# Patient Record
Sex: Male | Born: 1995 | Race: White | Hispanic: No | Marital: Single | Smoking: Never smoker
Health system: Southern US, Community
[De-identification: ages and names within clinical notes are randomized; demographics above are authoritative.]

## PROBLEM LIST (undated history)

## (undated) DIAGNOSIS — T7840XA Allergy, unspecified, initial encounter: Secondary | ICD-10-CM

## (undated) DIAGNOSIS — R569 Unspecified convulsions: Secondary | ICD-10-CM

## (undated) HISTORY — PX: NO PAST SURGERIES: SHX2092

## (undated) HISTORY — DX: Allergy, unspecified, initial encounter: T78.40XA

---
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
## 2015-04-15 ENCOUNTER — Ambulatory Visit
Admission: EM | Admit: 2015-04-15 | Discharge: 2015-04-15 | Disposition: A | Discharge status: Home / Self Care | Payer: Medicaid Other | Attending: Family Medicine | Admitting: Family Medicine

## 2015-04-15 ENCOUNTER — Encounter: Payer: Self-pay | Admitting: Emergency Medicine

## 2015-04-15 DIAGNOSIS — B070 Plantar wart: Secondary | ICD-10-CM

## 2015-04-15 HISTORY — DX: Unspecified convulsions: R56.9

## 2015-04-15 NOTE — ED Provider Notes (Signed)
CSN: [id number]     Arrival date & time 04/15/15  1328 History   First MD Initiated Contact with Patient 04/15/15 1549     Chief Complaint  Patient presents with  . Foot Pain   (Consider location/radiation/quality/duration/timing/severity/associated sxs/prior Treatment) HPI Comments: 19 yo male with a 2-4 weeks h/o right foot pain to bottom of foot. States he noticed 2 "sore spots" that are more painful when he's walking. Denies any trauma, injuries, drainage, redness, swelling.   The history is provided by the patient.    Past Medical History  Diagnosis Date  . Seizures [facility](HCC)    Past Surgical History  Procedure Laterality Date  . No past surgeries     No family history on file. Social History  Substance Use Topics  . Smoking status: Never Smoker   . Smokeless tobacco: Never Used  . Alcohol Use: No    Review of Systems  Allergies  Review of patient's allergies indicates no known allergies.  Home Medications   Prior to Admission medications   Not on File   Meds Ordered and Administered this Visit  Medications - No data to display  BP 117/78 mmHg  Pulse 52  Temp(Src) 97.5 F (36.4 C) (Tympanic)  Resp 18  Ht 5\' 8"  (1.727 m)  Wt 173 lb 3.2 oz (78.563 kg)  BMI 26.34 kg/m2  SpO2 100% No data found.   Physical Exam  Constitutional: He appears well-developed and well-nourished. No distress.  Musculoskeletal:       Right foot: Normal.  Skin: He is not diaphoretic.  2 plantar wart-like skin lesions on right plantar foot with mild tenderness to palpation; no drainage, redness or swelling.   Nursing note and vitals reviewed.   ED Course  Procedures (including critical care time)  Labs Review Labs Reviewed - No data to display  Imaging Review No results found.   Visual Acuity Review  Right Eye Distance:   Left Eye Distance:   Bilateral Distance:    Right Eye Near:   Left Eye Near:    Bilateral Near:         MDM   1. Plantar warts   (right  foot)  1. diagnosis reviewed with patient; recommend otc wart medication  2.Follow-up prn if symptoms worsen or don't improve   Payton Mccallumrlando Clifford Benninger, MD 04/15/15 1650

## 2015-04-15 NOTE — ED Notes (Signed)
Blister on bottom of right foot for 2 weeks.  Painful and hard to walk

## 2015-04-15 NOTE — Discharge Instructions (Signed)
Plantar Warts Warts are small growths on the skin. They can occur on various areas of the body. When they occur on the underside (sole) of the foot, they are called plantar warts. Plantar warts often occur in groups, with several small warts around a larger growth. They tend to develop over areas of pressure, such as the heel or the ball of the foot. Most warts are not painful, and they usually do not cause problems. However, plantar warts may cause pain when you walk because pressure is applied to them. Warts often go away on their own in time. Various treatments may be done if needed. Sometimes, warts go away and then they come back again. CAUSES Plantar warts are caused by a type of virus that is called human papillomavirus (HPV). HPV attacks a break in the skin of the foot. Walking barefoot can lead to exposure to the virus. These warts may spread to other areas of the sole. They spread to other areas of the body only through direct contact. RISK FACTORS Plantar warts are more likely to develop in:  People who are 10-20 years of age.  People who use public showers or locker rooms.  People who have a weakened body defense system (immune system). SYMPTOMS Plantar warts may be flat or slightly raised. They may grow into the deeper layers of skin or rise above the surface of the skin. Most plantar warts have a rough surface. They may cause pain when you use your foot to support your body weight. DIAGNOSIS A plantar wart can usually be diagnosed from its appearance. In some cases, a tissue sample may be removed (biopsy) to be looked at under a microscope. TREATMENT In many cases, warts do not need treatment. Without treatment, they often go away over a period of many months to a couple years. If treatment is needed, options may include:  Applying medicated solutions, creams, or patches to the wart. These may be over-the-counter or prescription medicines that make the skin soft so that layers will  gradually shed away. In many cases, the medicine is applied one or two times per day and covered with a bandage.  Putting duct tape over the top of the wart (occlusion). You will leave the tape in place for as long as told by your health care provider, then you will replace it with a new strip of tape. This is done until the wart goes away.  Freezing the wart with liquid nitrogen (cryotherapy).  Burning the wart with:  Laser treatment.  An electrified probe (electrocautery).  Injection of a medicine (Candida antigen) into the wart to help the body's immune system to fight off the wart.  Surgery to remove the wart. HOME CARE INSTRUCTIONS  Apply medicated creams or solutions only as told by your health care provider. This may involve:  Soaking the affected area in warm water.  Removing the top layer of softened skin before you apply the medicine. A pumice stone works well for removing the tissue.  Applying a bandage over the affected area after you apply the medicine.  Repeating the process daily or as told by your health care provider.  Do not scratch or pick at a wart.  Wash your hands after you touch a wart.  If a wart is painful, try applying a bandage with a hole in the middle over the wart. The helps to take pressure off the wart.  Keep all follow-up visits as told by your health care provider. This is important. PREVENTION   Take these actions to help prevent warts:  Wear shoes and socks. Change your socks daily.  Keep your feet clean and dry.  Check your feet regularly.  Avoid direct contact with warts on other people. SEEK MEDICAL CARE IF:  Your warts do not improve after treatment.  You have redness, swelling, or pain at the site of a wart.  You have bleeding from a wart that does not stop with light pressure.  You have diabetes and you develop a wart.   This information is not intended to replace advice given to you by your health care provider. Make sure  you discuss any questions you have with your health care provider.   Document Released: 06/27/2003 Document Revised: 12/26/2014 Document Reviewed: 07/02/2014 Elsevier Interactive Patient Education 2016 Elsevier Inc.  

## 2015-10-20 ENCOUNTER — Ambulatory Visit
Admission: EM | Admit: 2015-10-20 | Discharge: 2015-10-20 | Disposition: A | Discharge status: Home / Self Care | Payer: Medicaid Other | Attending: Family Medicine | Admitting: Family Medicine

## 2015-10-20 ENCOUNTER — Encounter: Payer: Self-pay | Admitting: Emergency Medicine

## 2015-10-20 ENCOUNTER — Ambulatory Visit: Payer: Medicaid Other

## 2015-10-20 DIAGNOSIS — S93401A Sprain of unspecified ligament of right ankle, initial encounter: Secondary | ICD-10-CM | POA: Diagnosis not present

## 2015-10-20 DIAGNOSIS — M79671 Pain in right foot: Secondary | ICD-10-CM | POA: Diagnosis not present

## 2015-10-20 DIAGNOSIS — M25571 Pain in right ankle and joints of right foot: Secondary | ICD-10-CM | POA: Diagnosis present

## 2015-10-20 DIAGNOSIS — X58XXXA Exposure to other specified factors, initial encounter: Secondary | ICD-10-CM | POA: Diagnosis not present

## 2015-10-20 NOTE — Discharge Instructions (Signed)
Apply ice and elevate. Use crutches and splint as long as pain continues.  Rest. Drink plenty of fluids.   Follow up with orthopedic this week as needed for continued pain.   Follow up with your primary care physician this week as needed. Return to Urgent care for new or worsening concerns.    Ankle Sprain An ankle sprain is an injury to the strong, fibrous tissues (ligaments) that hold the bones of your ankle joint together.  CAUSES An ankle sprain is usually caused by a fall or by twisting your ankle. Ankle sprains most commonly occur when you step on the outer edge of your foot, and your ankle turns inward. People who participate in sports are more prone to these types of injuries.  SYMPTOMS   Pain in your ankle. The pain may be present at rest or only when you are trying to stand or walk.  Swelling.  Bruising. Bruising may develop immediately or within 1 to 2 days after your injury.  Difficulty standing or walking, particularly when turning corners or changing directions. DIAGNOSIS  Your caregiver will ask you details about your injury and perform a physical exam of your ankle to determine if you have an ankle sprain. During the physical exam, your caregiver will press on and apply pressure to specific areas of your foot and ankle. Your caregiver will try to move your ankle in certain ways. An X-ray exam may be done to be sure a bone was not broken or a ligament did not separate from one of the bones in your ankle (avulsion fracture).  TREATMENT  Certain types of braces can help stabilize your ankle. Your caregiver can make a recommendation for this. Your caregiver may recommend the use of medicine for pain. If your sprain is severe, your caregiver may refer you to a surgeon who helps to restore function to parts of your skeletal system (orthopedist) or a physical therapist. HOME CARE INSTRUCTIONS   Apply ice to your injury for 1-2 days or as directed by your caregiver. Applying ice  helps to reduce inflammation and pain.  Put ice in a plastic bag.  Place a towel between your skin and the bag.  Leave the ice on for 15-20 minutes at a time, every 2 hours while you are awake.  Only take over-the-counter or prescription medicines for pain, discomfort, or fever as directed by your caregiver.  Elevate your injured ankle above the level of your heart as much as possible for 2-3 days.  If your caregiver recommends crutches, use them as instructed. Gradually put weight on the affected ankle. Continue to use crutches or a cane until you can walk without feeling pain in your ankle.  If you have a plaster splint, wear the splint as directed by your caregiver. Do not rest it on anything harder than a pillow for the first 24 hours. Do not put weight on it. Do not get it wet. You may take it off to take a shower or bath.  You may have been given an elastic bandage to wear around your ankle to provide support. If the elastic bandage is too tight (you have numbness or tingling in your foot or your foot becomes cold and blue), adjust the bandage to make it comfortable.  If you have an air splint, you may blow more air into it or let air out to make it more comfortable. You may take your splint off at night and before taking a shower or bath. Wiggle your  toes in the splint several times per day to decrease swelling. SEEK MEDICAL CARE IF:   You have rapidly increasing bruising or swelling.  Your toes feel extremely cold or you lose feeling in your foot.  Your pain is not relieved with medicine. SEEK IMMEDIATE MEDICAL CARE IF:  Your toes are numb or blue.  You have severe pain that is increasing. MAKE SURE YOU:   Understand these instructions.  Will watch your condition.  Will get help right away if you are not doing well or get worse.   This information is not intended to replace advice given to you by your health care provider. Make sure you discuss any questions you have  with your health care provider.   Document Released: 04/06/2005 Document Revised: 04/27/2014 Document Reviewed: 04/18/2011 Elsevier Interactive Patient Education [employer] Elsevier Inc.

## 2015-10-20 NOTE — ED Provider Notes (Signed)
[facility]  ____________________________________________  Time seen: Approximately 3:57 PM  I have reviewed the triage vital signs and the nursing notes.   HISTORY  Chief Complaint Ankle Pain   HPI Erik Bailey is a 20 y.o. male  presents with friends and family at bedside for the complaints of right ankle pain post injury that occurred last night. Patient reports that he was playing football in the backyard with some friends and accidentally he rolled his right ankle. Denies fall to the ground. Denies head injury or loss of consciousness. Denies any other pain or injury. Patient states he was able to continue walking after the incident but states hasn't been able to apply weight to his right ankle today. Patient again denies any other pain. Denies any numbness or loss of sensation. Patient states not able to move right ankle as well as normal, due to pain. Denies history of fracture to right ankle or right foot. States right ankle pain is primarily with weightbearing. Denies pain radiation.   Past Medical History  Diagnosis Date  . Seizures (HCC)   . Seizures (HCC)     There are no active problems to display for this patient.   Past Surgical History  Procedure Laterality Date  . No past surgeries    . No past surgeries      Current Outpatient Rx  Name  Route  Sig  Dispense  Refill  .             Allergies Review of patient's allergies indicates no known allergies.  History reviewed. No pertinent family history.  Social History Social History  Substance Use Topics  . Smoking status: Never Smoker   . Smokeless tobacco: Never Used  . Alcohol Use: No    Review of Systems Constitutional: No fever/chills Eyes: No visual changes. ENT: No sore throat. Cardiovascular: Denies chest pain. Respiratory: Denies shortness of breath. Gastrointestinal: No abdominal pain.  No nausea, no vomiting.  No diarrhea.  No constipation. Genitourinary: Negative for  dysuria. Musculoskeletal: Negative for back pain. As above.  Skin: Negative for rash. Neurological: Negative for headaches, focal weakness or numbness.  10-point ROS otherwise negative.  ____________________________________________   PHYSICAL EXAM:  VITAL SIGNS: ED Triage Vitals  Enc Vitals Group     BP 10/20/15 1529 114/65 mmHg     Pulse Rate 10/20/15 1529 70     Resp 10/20/15 1529 18     Temp 10/20/15 1529 97.9 F (36.6 C)     Temp Source 10/20/15 1529 Tympanic     SpO2 10/20/15 1529 100 %     Weight 10/20/15 1529 180 lb (81.647 kg)     Height 10/20/15 1529 5\' 8"  (1.727 m)     Head Cir --      Peak Flow --      Pain Score 10/20/15 1529 9     Pain Loc --      Pain Edu? --      Excl. in GC? --     Constitutional: Alert and oriented. Well appearing and in no acute distress. Eyes: Conjunctivae are normal. PERRL. EOMI. Head: Atraumatic.  Ears: normal external appearance bilaterally.    Nose: No congestion/rhinnorhea.  Mouth/Throat: Mucous membranes are moist.   Neck: No stridor.  No cervical spine tenderness to palpation. Cardiovascular: Normal rate, regular rhythm. Grossly normal heart sounds.  Good peripheral circulation. Respiratory: Normal respiratory effort.  No retractions. Lungs CTAB. No wheezes, rales or rhonchi. Gastrointestinal: Soft and nontender.  Musculoskeletal:  No lower or upper extremity tenderness nor edema. No cervical, thoracic or lumbar tenderness to palpation. Bilateral pedal pulses equal and easily palpated.  Except: right lateral malleolus mild to moderate swelling and tenderness to palpation, mild medial malleolus tenderness to palpation without noted swelling, mild lateral to mid mid foot tenderness to palpation, no ecchymosis, pain with ankle rotation, right lower extremity otherwise nontender, sensation to right lower extremity intact with normal distal capillary refill.  Neurologic:  Normal speech and language. No gross focal neurologic deficits  are appreciated. No gait instability. Skin:  Skin is warm, dry and intact. No rash noted. Psychiatric: Mood and affect are normal. Speech and behavior are normal.  ____________________________________________   LABS (all labs ordered are listed, but only abnormal results are displayed)  Labs Reviewed - No data to display  RADIOLOGY  No results found. ____________________________________________   PROCEDURES  Procedure(s) performed:  Crutches and a Velcro stirrup splint right ankle applied by RN. Neurovascular intact post splint application.  ____________________________________________   INITIAL IMPRESSION / ASSESSMENT AND PLAN / ED COURSE  Pertinent labs & imaging results that were available during my care of the patient were reviewed by me and considered in my medical decision making (see chart for details).  Well-appearing patient. No acute distress. Presents for right ankle and right foot pain post mechanical injury last night while playing football in which he accidentally rolled his right ankle. Denies any other injury. Dorsal foot, medial ankle and lateral right ankle pain. Will evaluate x-rays.   Per radiologist right ankle and right foot negative for acute fracture. Discussed with patient and family. Encouraged supportive measures. Splint and crutches as long as pain continues as needed. Encourage ice, rest, elevation and the counter ibuprofen or Tylenol as needed. Patient verbalized understanding and agreed to this plan. Information for orthopedic given for patient follow-up with as needed for continued pain.  Discussed follow up with Primary care physician this week. Discussed follow up and return parameters including no resolution or any worsening concerns. Patient verbalized understanding and agreed to plan.   ____________________________________________   FINAL CLINICAL IMPRESSION(S) / ED DIAGNOSES  Final diagnoses:  Foot pain, right  Right ankle sprain,  initial encounter     Discharge Medication List as of 10/20/2015  4:18 PM      Note: This dictation was prepared with Dragon dictation along with smaller phrase technology. Any transcriptional errors that result from this process are unintentional.       Renford DillsLindsey Jerney Baksh, NP 10/24/15 1704

## 2015-10-20 NOTE — ED Notes (Signed)
Right ankle injury while playing football last night.

## 2020-11-18 ENCOUNTER — Ambulatory Visit: Payer: Self-pay

## 2020-11-18 ENCOUNTER — Ambulatory Visit
Admission: EM | Admit: 2020-11-18 | Discharge: 2020-11-18 | Disposition: A | Discharge status: Home / Self Care | Payer: Managed Care, Other (non HMO) | Attending: Physician Assistant | Admitting: Physician Assistant

## 2020-11-18 ENCOUNTER — Encounter: Payer: Self-pay | Admitting: Emergency Medicine

## 2020-11-18 ENCOUNTER — Other Ambulatory Visit: Payer: Self-pay

## 2020-11-18 DIAGNOSIS — R21 Rash and other nonspecific skin eruption: Secondary | ICD-10-CM | POA: Diagnosis not present

## 2020-11-18 DIAGNOSIS — W57XXXA Bitten or stung by nonvenomous insect and other nonvenomous arthropods, initial encounter: Secondary | ICD-10-CM

## 2020-11-18 MED ORDER — TRIAMCINOLONE ACETONIDE 0.5 % EX OINT: 1.0000 "application " | TOPICAL_OINTMENT | Freq: Two times a day (BID) | CUTANEOUS | 0 refills | Status: DC

## 2020-11-18 NOTE — ED Triage Notes (Signed)
Pt states he was walking in the woods about 2 days ago and noticed he had about 60-70 ticks on him at once. He states he is having body aches and fatigue. He states he had this same thing happen to him last year and has had a "hard time" eating pork since. He is requesting tick bourne illness testing and an antibiotic.

## 2020-11-18 NOTE — Telephone Encounter (Signed)
Pt. Reports he was bit by 60 ticks Saturday. Bites are on his arms, legs, back. Has muscle aches and fatigue. Instructed pt. To go to UC. Verbalizes understanding.   Reason for Disposition  [1] Fever AND [2] spreading red area or streak  Answer Assessment - Initial Assessment Questions 1. TYPE of TICK: "Is it a wood tick or a deer tick?" (e.g., deer tick, wood tick; unsure)     Small 2. SIZE of TICK: "How big is the tick?" (e.g., size of poppy seed, apple seed, watermelon seed; unsure) Note: Deer ticks can be the size of a poppy seed (nymph) or an apple seed (adult).      Small 3. ENGORGED: "Did the tick look flat or engorged (full, swollen)?" (e.g., flat, engorged; unsure)     Small 4. LOCATION: "Where is the tick bite located?"      Legs, ankles, buttocks, back arms 5. ONSET: "How long do you think the tick was attached before you removed it?" (e.g., 5 hours, 2 days)      Saturday 6. APPEARANCE of BITE or RASH: "What does the site look like?"     Red 7. PREGNANCY: "Is there any chance you are pregnant?" "When was your last menstrual period?"     N/a  Protocols used: Tick Bite-A-AH

## 2020-11-18 NOTE — Discharge Instructions (Addendum)
You may return tomorrow to have labs drawn for tick testing.  This will show if you sustain any tickborne illnesses from your incident last year but it is too soon to know if you have contracted any tick illnesses based on your recent tick bites.  I would have you follow-up in another 7 to 10 days to consider tick testing.  Please follow-up with your PCP.  I have sent an ointment to help with the swelling of multiple bites.  Also take Benadryl for any itching.  If you notice that you have a fever, increased fatigue, bull's-eye type rash then you need to be seen again sooner and we can consider treating with an antibiotic but no indication at this time since I do not detect a bull's-eye type rash and you do not have a fever in the ticks or not attached greater than 72 hours.  This is also not an endemic area.

## 2020-11-19 NOTE — ED Provider Notes (Signed)
[facility]    CSN: [id number] Arrival date & time: 11/18/20  1607      History   Chief Complaint Chief Complaint  Patient presents with   Insect Bite    HPI Erik Bailey is a 25 y.o. male presenting for concerns about multiple tick bites.  Patient states that 2 days ago he was out in the woods in  and pulled "about 60 small black ticks off of him."  Patient says the longest that could have been attached would have been about 5 hours.  He says since then he has developed some body aches mostly in his legs and fatigue.  Patient is concerned about tickborne illnesses.  He says that the same exact thing happened to him about a year ago where he had an excessive amount of tick bites and developed some the symptoms as well as having difficulty eating pork ever since.  He says that it does not taste the same and he cannot digest it well.  Patient denies any fevers, joint swelling, headaches, weakness, URI symptoms, chest discomfort, shortness of breath, nausea/vomiting or diarrhea..  He denies any erythema migrans type rashes.  He has numerous small erythematous papules all over his body, primarily his legs.  These areas are itchy and mildly tender to touch.  Patient has never been tested for tickborne illnesses and requests to be tested today.  He would also like to start on antibiotics.  He has not treated this in any way himself yet.  He has no other complaints.  HPI  Past Medical History:  Diagnosis Date   Seizures (HCC)    Seizures (HCC)     There are no problems to display for this patient.   Past Surgical History:  Procedure Laterality Date   NO PAST SURGERIES     NO PAST SURGERIES         Home Medications    Prior to Admission medications   Medication Sig Start Date End Date Taking? Authorizing Provider  triamcinolone ointment (KENALOG) 0.5 % Apply 1 application topically 2 (two) times daily. 11/18/20  Yes Shirlee Latch, PA-C  diphenhydrAMINE (SOMINEX) 25  MG tablet Take 25 mg by mouth at bedtime as needed for sleep.    [provider]    Family History No family history on file.  Social History Social History   Tobacco Use   Smoking status: Never   Smokeless tobacco: Never  Vaping Use   Vaping Use: Some days  Substance Use Topics   Alcohol use: Yes   Drug use: No     Allergies   Patient has no known allergies.   Review of Systems Review of Systems  Constitutional:  Positive for fatigue. Negative for chills, diaphoresis and fever.  Respiratory:  Negative for cough and shortness of breath.   Cardiovascular:  Negative for chest pain and palpitations.  Gastrointestinal:  Negative for abdominal pain, diarrhea, nausea and vomiting.  Musculoskeletal:  Positive for myalgias.  Skin:  Positive for color change and rash.  Neurological:  Negative for dizziness, weakness and headaches.    Physical Exam Triage Vital Signs ED Triage Vitals  Enc Vitals Group     BP 11/18/20 1657 133/82     Pulse Rate 11/18/20 1657 (!) 57     Resp 11/18/20 1657 18     Temp 11/18/20 1657 98.3 F (36.8 C)     Temp Source 11/18/20 1657 Oral     SpO2 11/18/20 1657 100 %  Weight 11/18/20 1658 179 lb 14.3 oz (81.6 kg)     Height 11/18/20 1658 5\' 8"  (1.727 m)     Head Circumference --      Peak Flow --      Pain Score 11/18/20 1658 3     Pain Loc --      Pain Edu? --      Excl. in GC? --    No data found.  Updated Vital Signs BP 133/82 (BP Location: Left Arm)   Pulse (!) 57   Temp 98.3 F (36.8 C) (Oral)   Resp 18   Ht 5\' 8"  (1.727 m)   Wt 179 lb 14.3 oz (81.6 kg)   SpO2 100%   BMI 27.35 kg/m      Physical Exam Vitals and nursing note reviewed.  Constitutional:      General: He is not in acute distress.    Appearance: Normal appearance. He is well-developed. He is not ill-appearing.  HENT:     Head: Normocephalic and atraumatic.     Nose: Nose normal.     Mouth/Throat:     Mouth: Mucous membranes are moist.      Pharynx: Oropharynx is clear.  Eyes:     General: No scleral icterus.    Conjunctiva/sclera: Conjunctivae normal.  Cardiovascular:     Rate and Rhythm: Regular rhythm. Bradycardia present.     Heart sounds: Normal heart sounds.  Pulmonary:     Effort: Pulmonary effort is normal. No respiratory distress.     Breath sounds: Normal breath sounds.  Abdominal:     Palpations: Abdomen is soft.     Tenderness: There is no abdominal tenderness.  Musculoskeletal:     Cervical back: Neck supple.  Skin:    General: Skin is warm and dry.     Comments: There are numerous (about 30-40) erythematous papules on ankles and lower legs and a few on the arms.  Areas are very minimally tender to palpation.  Full body search does not detect any erythema migrans type rashes.  No joint swelling.  Full range of motion of all joints.  Neurological:     General: No focal deficit present.     Mental Status: He is alert. Mental status is at baseline.     Motor: No weakness.     Gait: Gait normal.  Psychiatric:        Mood and Affect: Mood normal.        Behavior: Behavior normal.        Thought Content: Thought content normal.     UC Treatments / Results  Labs (all labs ordered are listed, but only abnormal results are displayed) Labs Reviewed - No data to display  EKG   Radiology No results found.  Procedures Procedures (including critical care time)  Medications Ordered in UC Medications - No data to display  Initial Impression / Assessment and Plan / UC Course  I have reviewed the triage vital signs and the nursing notes.  Pertinent labs & imaging results that were available during my care of the patient were reviewed by me and considered in my medical decision making (see chart for details).  25 year old male presenting for multiple tick bites.  He states he had about 60 ticks that he pulled off of himself.  He also admits to fatigue and achiness of his legs.  Symptoms started about 2 days  ago.  Patient denies noticing any ticks in place for greater than 72 hours.  He  was hiking in the woods in [place] which is not an endemic area for ticks.  Patient concerned about tickborne illnesses.  No erythema migrans type rashes or fevers.  Exam today reveals numerous erythematous papules consistent with some sort of bug bite on his ankles, legs and arms.  The remainder the exam is within normal limits.  Advised patient if we perform tick testing today it is too soon to detect if he picked up any tickborne illnesses from the bites he had 2 days ago.  Advised him that it takes 10 days to 2 weeks or sometimes longer for antibodies to develop into the blood where they can be tested.  Patient says he still wants to be tested mostly to see if he acquired a tickborne illness last year.  Advised that he will need to return for the lab draw as our lab is closed.  Patient says he will return tomorrow.  Advised him that I do not want to treat him for any tickborne illnesses at this time given he does not really meet the criteria for treatment for tick diseases and we will review what his labs show and consider treatment at that time if positive.  If negative then I advised that he waits another 10 days to 2 weeks and considers retesting through his primary care provider if he still concerned about tick illnesses or sooner if he develops fever or erythema migrans type rash.  For the time being supportive care advised.  I have sent in triamcinolone topical.  Advised to follow-up with [place] as needed.   Final Clinical Impressions(s) / UC Diagnoses   Final diagnoses:  Rash and nonspecific skin eruption  Tick bite, unspecified site, initial encounter     Discharge Instructions      You may return tomorrow to have labs drawn for tick testing.  This will show if you sustain any tickborne illnesses from your incident last year but it is too soon to know if you have contracted any tick illnesses based on your  recent tick bites.  I would have you follow-up in another 7 to 10 days to consider tick testing.  Please follow-up with your PCP.  I have sent an ointment to help with the swelling of multiple bites.  Also take Benadryl for any itching.  If you notice that you have a fever, increased fatigue, bull's-eye type rash then you need to be seen again sooner and we can consider treating with an antibiotic but no indication at this time since I do not detect a bull's-eye type rash and you do not have a fever in the ticks or not attached greater than 72 hours.  This is also not an endemic area.   ED Prescriptions     Medication Sig Dispense Auth. Provider   triamcinolone ointment (KENALOG) 0.5 % Apply 1 application topically 2 (two) times daily. 30 g Gareth Morgan      PDMP not reviewed this encounter.   Shirlee Latch, PA-C 11/19/20 [phone]

## 2021-01-20 ENCOUNTER — Ambulatory Visit (INDEPENDENT_AMBULATORY_CARE_PROVIDER_SITE_OTHER): Payer: Managed Care, Other (non HMO) | Admitting: Family Medicine

## 2021-01-20 ENCOUNTER — Other Ambulatory Visit: Payer: Self-pay

## 2021-01-20 ENCOUNTER — Encounter: Payer: Self-pay | Admitting: Family Medicine

## 2021-01-20 VITALS — BP 136/80 | HR 58 | Ht 68.0 in | Wt 216.8 lb

## 2021-01-20 DIAGNOSIS — J3089 Other allergic rhinitis: Secondary | ICD-10-CM

## 2021-01-20 DIAGNOSIS — Z7689 Persons encountering health services in other specified circumstances: Secondary | ICD-10-CM

## 2021-01-20 DIAGNOSIS — W57XXXS Bitten or stung by nonvenomous insect and other nonvenomous arthropods, sequela: Secondary | ICD-10-CM

## 2021-01-20 DIAGNOSIS — F900 Attention-deficit hyperactivity disorder, predominantly inattentive type: Secondary | ICD-10-CM

## 2021-01-20 DIAGNOSIS — Z91018 Allergy to other foods: Secondary | ICD-10-CM

## 2021-01-20 NOTE — Progress Notes (Signed)
Subjective:    Patient ID: Erik Bailey, male    DOB: [birth year], 25 y.o.   MRN: [id number]  Erik Bailey is a 25 y.o. male presenting on 01/20/2021 for Establish Care  Establish care.  HPI  Food Allergy testing Family history Multiple family members with food allergies and esophageal problems. Also mother and brother have similar problems He admits avocado and nuts and watermelon have bothered him with some itching and bloating symptoms. He had multiple ticks 40-60 in past. Last year and this year. 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  Inattention / Difficulty focusing He has some Mental Health questions today Looking into options for focus / inattention His siblings have been diagnosed with ADHD in past. He says he feels like it has always affected him but never addressed. He says since in school middle school and high school had issues with some reading comprehension and focusing, difficulty getting school work. He may know the answers or be prepared but difficulty taking test. He admits difficulty with completing daily tasks and completing different tasks and not skipping around. Also admits difficulty with adhering to lifestyle regimen and diet. 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 Difficulty keeping up with conversation His brother was taking medication in childhood. He had 5 other siblings. He was the oldest.  Left Shoulder Pain History of injury 1-2 months ago unsure what he did Interested in chiropractor recommendations.  Hx Plantar Wart    Depression screen [facility] 2/9 01/20/2021  Decreased Interest 2  Down, Depressed, Hopeless 0  PHQ - 2 Score 2  Altered sleeping 0  Tired, decreased energy 0  Change in appetite 2  Feeling bad or failure about yourself  0  Trouble concentrating 1  Moving slowly or fidgety/restless 0  Suicidal thoughts 0  PHQ-9 Score 5  Difficult doing work/chores Not difficult at all   GAD 7 : Generalized Anxiety  Score 01/20/2021  Nervous, Anxious, on Edge 0  Control/stop worrying 0  Worry too much - different things 0  Trouble relaxing 0  Restless 0  Easily annoyed or irritable 0  Afraid - awful might happen 0  Total GAD 7 Score 0  Anxiety Difficulty Not difficult at all     Past Medical History:  Diagnosis Date   Allergy    Seizures (HCC)    Seizures (HCC)    Past Surgical History:  Procedure Laterality Date   NO PAST SURGERIES     NO PAST SURGERIES     WISDOM TOOTH EXTRACTION  2015   Social History   Socioeconomic History   Marital status: Single    Spouse name: Not on file   Number of children: Not on file   Years of education: Not on file   Highest education level: Not on file  Occupational History   Not on file  Tobacco Use   Smoking status: Never   Smokeless tobacco: Never  Vaping Use   Vaping Use: Former  Substance and Sexual Activity   Alcohol use: Yes    Alcohol/week: 2.0 standard drinks    Types: 2 Standard drinks or equivalent per week   Drug use: No   Sexual activity: Not on file  Other Topics Concern   Not on file  Social History Narrative   Not on file   Social Determinants of Health   Financial Resource Strain: Not on file  Food Insecurity: Not on file  Transportation Needs: Not on file  Physical  Activity: Not on file  Stress: Not on file  Social Connections: Not on file  Intimate Partner Violence: Not on file   History reviewed. No pertinent family history. Current Outpatient Medications on File Prior to Visit  Medication Sig   loratadine (CLARITIN) 10 MG tablet Take 10 mg by mouth daily as needed for allergies.   No current facility-administered medications on file prior to visit.    Review of Systems Per HPI unless specifically indicated above      Objective:    BP 136/80 (BP Location: Left Arm, Cuff Size: Normal)   Pulse (!) 58   Ht 5\' 8"  (1.727 m)   Wt 216 lb 12.8 oz (98.3 kg)   SpO2 100%   BMI 32.96 kg/m   Wt Readings from  Last 3 Encounters:  01/20/21 216 lb 12.8 oz (98.3 kg)  11/18/20 179 lb 14.3 oz (81.6 kg)  10/20/15 180 lb (81.6 kg)    Physical Exam Vitals and nursing note reviewed.  Constitutional:      General: He is not in acute distress.    Appearance: Normal appearance. He is well-developed. He is not diaphoretic.     Comments: Well-appearing, comfortable, cooperative  HENT:     Head: Normocephalic and atraumatic.  Eyes:     General:        Right eye: No discharge.        Left eye: No discharge.     Conjunctiva/sclera: Conjunctivae normal.  Cardiovascular:     Rate and Rhythm: Normal rate.  Pulmonary:     Effort: Pulmonary effort is normal.  Skin:    General: Skin is warm and dry.     Findings: No erythema or rash.  Neurological:     Mental Status: He is alert and oriented to person, place, and time.  Psychiatric:        Mood and Affect: Mood normal.        Behavior: Behavior normal.        Thought Content: Thought content normal.     Comments: Well groomed, good eye contact, normal speech and thoughts      No results found for this or any previous visit.    Assessment & Plan:   Problem List Items Addressed This Visit     Attention deficit hyperactivity disorder (ADHD), predominantly inattentive type   Other Visit Diagnoses     Environmental and seasonal allergies    -  Primary   Encounter to establish care with new doctor       Food allergy       Relevant Orders   Food Allergy Profile   Tick bite of left lower leg, sequela       Relevant Orders   Alpha-Gal Panel   B. burgdorfi antibodies      Environmental / Seasonal Allergies On Claritin anti histamine PRN  Food Allergies Multiple foods described with suspected food allergy Hx symptoms of itching and allergic response to some nuts, watermelon, avocado, and also issues with pork. Fam history with similar food allergies He requests lab testing for food allergy He has had tick bites in past multiples requesting  lab panel for tickborne illness including alpha gal testing. If concerning food allergies identified or other issue we can refer to Allergy/Immunology after lab results reviewed.  Tick Bites See above, past 1-2 years with multiple tick bites.  ADHD predominant inattention Long history of reported symptoms impacting his school and work and daily function No prior formal testing Known  fam history siblings with dx and treatment He has never been treated Will proceed w/ handout AVS info on psych therapy locations that can do evaluation / formal testing and if treatment medication is indicated can return here to discuss or f/u with mental health for management.   Orders Placed This Encounter  Procedures   Alpha-Gal Panel   Food Allergy Profile   B. burgdorfi antibodies     No orders of the defined types were placed in this encounter.    Follow up plan: Return in about 6 weeks (around 03/03/2021), or if symptoms worsen or fail to improve, for 6 weeks as needed ADHD psych f/u / Allergy updates.  [name], DO [facility] 01/20/2021, 3:19 PM

## 2021-01-20 NOTE — Patient Instructions (Addendum)
Thank you for coming to the office today.  Chiropractor options  Dr Patrici Ranks (Jorja Loa and Jonny Ruiz) one in [place] and one [place].  [address] [place], [place] [address]  [address], [place], [place] [address]   [phone]  Check with them first / insurance to see if you need a doctor's referral order or if you can just go ahead and schedule. Let me know.  Labs today for food allergy panel  Includes IgE allergy testing for: Almond (f20) Cashew Nut (f202) Codfish (f3) Cow's Milk (f2) Egg White (f1) Hazelnut (f17) Peanut (f13) Salmon (f41) Scallop (f338) Sesame Seed (f10) Shrimp (f24) Soybean (f14) Tuna (f40) Walnut (f256) Wheat (f4)  Plus testing for tick borne illness lyme disease, alpha gal (beef and pork)  --------------------------------------------------------------------------------  These offices have both PSYCHIATRY doctors and Haematologist Available) [place]  [address] [place], [place] [address] Phone: [phone]  [facility] Address: [address], [place], [place] [address] [email or web address] Phone: [phone]   [facility]) Address: [address], [place], [place] [address] Hours: 8:30AM-5PM Phone: [phone]  [facility] at [facility] [address] [place], [place] [address] Phone: [phone]  [facility] (All ages) [address], [place]vin Knack [place], [id number] Phone: [phone] (Option 1) [email or web address]  ----------------------------------------------------------------- THERAPIST ONLY  (No Psychiatry)  [facility] [address] [place], [place] [address] Darden Amber P: [phone]  Delight Ovens, LCSW [address] [place], [place] [address] Main Line:  [phone]  [facility]   Address: [address] [place], [place], [place] [address] Hours: Open today  9AM-7PM Phone: [phone]  [facility] [address], [facility]  - [facility] Address: [address], [place], [place] [address] Phone: [phone]    Please schedule a Follow-up Appointment to: Return in about 6 weeks (around 03/03/2021), or if symptoms worsen or fail to improve, for 6 weeks as needed ADHD psych f/u / Allergy updates.  If you have any other questions or concerns, please feel free to call the office or send a message through MyChart. You may also schedule an earlier appointment if necessary.  Additionally, you may be receiving a survey about your experience at our office within a few days to 1 week by e-mail or mail. We value your feedback.  [name], DO [facility] [place]

## 2021-01-23 ENCOUNTER — Other Ambulatory Visit: Payer: Self-pay | Admitting: Family Medicine

## 2021-01-23 DIAGNOSIS — Z91018 Allergy to other foods: Secondary | ICD-10-CM

## 2021-01-23 LAB — FOOD ALLERGY PROFILE
Allergen, Salmon, f41: 0.35 kU/L — ABNORMAL HIGH
Almonds: 9.98 kU/L — ABNORMAL HIGH
CLASS: 1
CLASS: 1
CLASS: 3
CLASS: 3
CLASS: 3
CLASS: 3
CLASS: 3
CLASS: 3
CLASS: 4
Cashew IgE: 0.31 kU/L — ABNORMAL HIGH
Class: 1
Class: 3
Class: 3
Egg White IgE: 0.28 kU/L — ABNORMAL HIGH
Fish Cod: 0.42 kU/L — ABNORMAL HIGH
Hazelnut: 25 kU/L — ABNORMAL HIGH
Milk IgE: 0.47 kU/L — ABNORMAL HIGH
Peanut IgE: 12.6 kU/L — ABNORMAL HIGH
Scallop IgE: 5.57 kU/L — ABNORMAL HIGH
Sesame Seed f10: 12 kU/L — ABNORMAL HIGH
Shrimp IgE: 4.51 kU/L — ABNORMAL HIGH
Soybean IgE: 10.6 kU/L — ABNORMAL HIGH
Tuna IgE: 0.27 kU/L — ABNORMAL HIGH
Walnut: 7.91 kU/L — ABNORMAL HIGH
Wheat IgE: 10.5 kU/L — ABNORMAL HIGH

## 2021-01-23 LAB — ALPHA-GAL PANEL
Beef IgE: 5.04 kU/L — ABNORMAL HIGH (ref <0.35)
Class: 2
Class: 2
Class: 3
Galactose-alpha-1,3-galactose IgE: 80.7 kU/L — ABNORMAL HIGH (ref <0.10)
LAMB/MUTTON IGE: 0.74 kU/L — ABNORMAL HIGH (ref <0.35)
Pork IgE: 3.35 kU/L — ABNORMAL HIGH (ref <0.35)

## 2021-01-23 LAB — INTERPRETATION:

## 2021-01-23 LAB — B. BURGDORFI ANTIBODIES: B burgdorferi Ab IgG+IgM: <0.9 index

## 2021-01-23 MED ORDER — EPINEPHRINE 0.3 MG/0.3ML IJ SOAJ: 0.3000 mg | INTRAMUSCULAR | 1 refills | Status: AC | PRN

## 2021-06-16 ENCOUNTER — Ambulatory Visit
Admission: RE | Admit: 2021-06-16 | Discharge: 2021-06-16 | Disposition: A | Discharge status: Home / Self Care | Payer: 59 | Source: Ambulatory Visit | Attending: Family Medicine | Admitting: Family Medicine

## 2021-06-16 ENCOUNTER — Other Ambulatory Visit: Payer: Self-pay

## 2021-06-16 ENCOUNTER — Encounter: Payer: Self-pay | Admitting: Family Medicine

## 2021-06-16 ENCOUNTER — Ambulatory Visit
Admission: RE | Admit: 2021-06-16 | Discharge: 2021-06-16 | Disposition: A | Discharge status: Home / Self Care | Payer: 59 | Source: Home / Self Care | Attending: Family Medicine | Admitting: Family Medicine

## 2021-06-16 ENCOUNTER — Ambulatory Visit (INDEPENDENT_AMBULATORY_CARE_PROVIDER_SITE_OTHER): Payer: 59 | Admitting: Family Medicine

## 2021-06-16 VITALS — BP 136/71 | HR 60 | Ht 68.0 in | Wt 215.6 lb

## 2021-06-16 DIAGNOSIS — S93402A Sprain of unspecified ligament of left ankle, initial encounter: Secondary | ICD-10-CM | POA: Insufficient documentation

## 2021-06-16 DIAGNOSIS — M25572 Pain in left ankle and joints of left foot: Secondary | ICD-10-CM

## 2021-06-16 NOTE — Progress Notes (Signed)
Subjective:    Patient ID: Erik Bailey, male    DOB: October 17, 1995, 26 y.o.   MRN: [id number]  Erik Bailey is a 26 y.o. male presenting on 06/16/2021 for Ankle Pain   HPI  LEFT Ankle Sprain, inversion  Reports acute basketball injury inversion ankle sprain 3 days ago on Friday. At time of injury had small bruise and needed help to ambulate. Bruising worsened on Left lateral ankle over next few days. He has tried icing and resting, still in pain. He has used ibuprofen PRN mixed result. He is currently able to ambulate. He has ASO - He has known prior history of multiple ankle inversion sprains in past, including L ankle.  Depression screen PHQ 2/9 01/20/2021  Decreased Interest 2  Down, Depressed, Hopeless 0  PHQ - 2 Score 2  Altered sleeping 0  Tired, decreased energy 0  Change in appetite 2  Feeling bad or failure about yourself  0  Trouble concentrating 1  Moving slowly or fidgety/restless 0  Suicidal thoughts 0  PHQ-9 Score 5  Difficult doing work/chores Not difficult at all    Social History   Tobacco Use   Smoking status: Never   Smokeless tobacco: Never  Vaping Use   Vaping Use: Former  Substance Use Topics   Alcohol use: Yes    Alcohol/week: 2.0 standard drinks    Types: 2 Standard drinks or equivalent per week   Drug use: No    Review of Systems Per HPI unless specifically indicated above     Objective:    BP 136/71    Pulse 60    Ht 5\' 8"  (1.727 m)    Wt 215 lb 9.6 oz (97.8 kg)    SpO2 100%    BMI 32.78 kg/m   Wt Readings from Last 3 Encounters:  06/16/21 215 lb 9.6 oz (97.8 kg)  01/20/21 216 lb 12.8 oz (98.3 kg)  11/18/20 179 lb 14.3 oz (81.6 kg)    Physical Exam Vitals and nursing note reviewed.  Constitutional:      General: He is not in acute distress.    Appearance: Normal appearance. He is well-developed. He is not diaphoretic.     Comments: Well-appearing, comfortable, cooperative  HENT:     Head: Normocephalic and atraumatic.  Eyes:      General:        Right eye: No discharge.        Left eye: No discharge.     Conjunctiva/sclera: Conjunctivae normal.  Cardiovascular:     Rate and Rhythm: Normal rate.  Pulmonary:     Effort: Pulmonary effort is normal.  Musculoskeletal:     Comments: Left ankle Foot Appears significant ecchymosis laterally inferior to ATF region. Due to gravity. Mild to moderate tenderness / pain over area of concern  Able to weightbear walking in office.  Skin:    General: Skin is warm and dry.     Findings: No erythema or rash.  Neurological:     Mental Status: He is alert and oriented to person, place, and time.  Psychiatric:        Mood and Affect: Mood normal.        Behavior: Behavior normal.        Thought Content: Thought content normal.     Comments: Well groomed, good eye contact, normal speech and thoughts   I have personally reviewed the radiology report from 06/16/21.  CLINICAL DATA:  Ankle inversion/brain.   EXAM: LEFT ANKLE  COMPLETE - 3+ VIEW   COMPARISON:  Left ankle radiographs 07/31/2011   FINDINGS: There is no evidence of fracture, dislocation, or joint effusion. There is no evidence of arthropathy or other focal bone abnormality. Regional soft tissue swelling.   IMPRESSION: No acute osseous abnormality in the left ankle.     Electronically Signed   By: Emmaline Kluver M.D.   On: 06/16/2021 15:02  Results for orders placed or performed in visit on 01/20/21  Alpha-Gal Panel  Result Value Ref Range   Galactose-alpha-1,3-galactose IgE 80.70 (H) <0.10 kU/L   Beef IgE 5.04 (H) <0.35 kU/L   Class 3    LAMB/MUTTON IGE 0.74 (H) <0.35 kU/L   Class 2    Pork IgE 3.35 (H) <0.35 kU/L   Class 2   Food Allergy Profile  Result Value Ref Range   Egg White IgE 0.28 (H) kU/L   Class 0/1    Peanut IgE 12.60 (H) kU/L   Class 3    Wheat IgE 10.50 (H) kU/L   CLASS 3    Walnut 7.91 (H) kU/L   CLASS 3    Fish Cod 0.42 (H) kU/L   CLASS 1    Milk IgE 0.47 (H) kU/L    Class 1    Soybean IgE 10.60 (H) kU/L   CLASS 3    Shrimp IgE 4.51 (H) kU/L   Class 3    Scallop IgE 5.57 (H) kU/L   CLASS 3    Sesame Seed f10  12.00 (H) kU/L   CLASS 3    Hazelnut 25.00 (H) kU/L   CLASS 4    Cashew IgE 0.31 (H) kU/L   CLASS 0/1    Almonds 9.98 (H) kU/L   CLASS 3    Allergen, Salmon, f41 0.35 (H) kU/L   CLASS 1    Tuna IgE 0.27 (H) kU/L   CLASS 0/1   B. burgdorfi antibodies  Result Value Ref Range   B burgdorferi Ab IgG+IgM <0.90 index  Interpretation:  Result Value Ref Range   Interpretation        Assessment & Plan:   Problem List Items Addressed This Visit   None Visit Diagnoses     Acute left ankle pain    -  Primary   Relevant Orders   DG Ankle Complete Left (Completed)   Inversion sprain of left ankle, initial encounter       Relevant Orders   DG Ankle Complete Left (Completed)       Consistent with moderate ankle sprain, likely grade 1-2, suspect inversion sprain given history and localized pain to lateral ATF ligament. Improving edema, PERSISTENT ecchymosis, tolerating wt bearing ambulation.  X-ray today.  Plan: 1. Reassurance, will continue to heal over next few weeks 2. Supportive measures with elevation, ice packs for edema, relative rest, may use walker at home to avoid increased stress. Consider CAM Walker boot for support if indicated. Handwritten rx 3. Tylenol up to 1000mg  q 6-8 hr PRN (max 3 doses in 24 hours) 4. Return criteria given  If not improving consider referral to Orthopedics vs Sports Medicine.   Orders Placed This Encounter  Procedures   DG Ankle Complete Left    Standing Status:   Future    Number of Occurrences:   1    Standing Expiration Date:   06/16/2022    Order Specific Question:   Reason for Exam (SYMPTOM  OR DIAGNOSIS REQUIRED)    Answer:   left ankle inversion sprain  3 days ago, bruising swelling pain    Order Specific Question:   Preferred imaging location?    Answer:   [facility] [place]     No  orders of the defined types were placed in this encounter.     Follow up plan: Return in about 4 weeks (around 07/14/2021), or if symptoms worsen or fail to improve.  [name], DO [facility] 06/16/2021, 2:13 PM

## 2021-06-16 NOTE — Patient Instructions (Addendum)
Thank you for coming to the office today.  Recommend trial of Anti-inflammatory with Naproxen (Naprosyn) 500mg  tabs - take one with food and plenty of water TWICE daily every day (breakfast and dinner), for next 1 to 2 weeks, then you may take only as needed - DO NOT TAKE any ibuprofen, aleve, motrin while you are taking this medicine - It is safe to take Tylenol Ext Str 500mg  tabs - take 1 to 2 (max dose 1000mg ) every 6 hours as needed for breakthrough pain, max 24 hour daily dose is 6 to 8 tablets or 4000mg    If needed for urgent evaluation  [facility] Address: [address] [place], [employer] account planner Hours:  9AM-5PM Phone: [phone]  --------------------   Ankle Sprain It seems like you have a moderate ankle sprain, this should gradually heal on its own, but it does take time to get back to 100% normal. The biggest concern is to avoid any re-injury as your ankle is now weaker while it is healing. Please try to avoid any activities that cause pain. It is important to modify your activities to allow this to heal. You may benefit from an Ankle Brace (over the counter) to limit movement in your ankle If you are unable to walk without pain after a few days, then crutches or CAM walker boot can be used   Ice the area for 20 minutes at least 3-4 times a day Wrap the ankle with a compression wrap to prevent swelling Elevate the leg above the level of the heart as much as possible  DME  [facility] [address] [place], [phone] [facility] Ph: [phone]  [facility] [address] [place], [address] [phone] Ph: [phone]  [facility] [address] [place], [phone] [facility] Open until 5PM Phone: [phone]  [facility] [address] [place], [phone] [facility] Ph [phone]   Please schedule a Follow-up  Appointment to: Return in about 4 weeks (around 07/14/2021), or if symptoms worsen or fail to improve.  If you have any other questions or concerns, please feel free to call the office or send a message through MyChart. You may also schedule an earlier appointment if necessary.  Additionally, you may be receiving a survey about your experience at our office within a few days to 1 week by e-mail or mail. We value your feedback.  [place], DO [facility], [phone]

## 2022-07-01 ENCOUNTER — Ambulatory Visit
Admission: EM | Admit: 2022-07-01 | Discharge: 2022-07-01 | Disposition: A | Discharge status: Home / Self Care | Payer: 59 | Attending: Emergency Medicine | Admitting: Emergency Medicine

## 2022-07-01 DIAGNOSIS — Z202 Contact with and (suspected) exposure to infections with a predominantly sexual mode of transmission: Secondary | ICD-10-CM | POA: Insufficient documentation

## 2022-07-01 MED ORDER — DOXYCYCLINE HYCLATE 100 MG PO CAPS: 100.0000 mg | ORAL_CAPSULE | Freq: Two times a day (BID) | ORAL | 0 refills | Status: AC

## 2022-07-01 NOTE — ED Notes (Signed)
Aptima self swab collected by pt but opted out in oral swab. Self swab sent to lab & oral swab not sent in.

## 2022-07-01 NOTE — Discharge Instructions (Signed)
Take the Doxycycline twice daily with food for 7 days for treatment following your exposure.  Abstain from unprotected intercourse until after you and your partner have completed antibiotic treatment.

## 2022-07-01 NOTE — ED Provider Notes (Signed)
[facility]    CSN: [id number] Arrival date & time: 07/01/22  1326      History   Chief Complaint Chief Complaint  Patient presents with   Exposure to STD    HPI Erik Bailey is a 27 y.o. male.   HPI  27 year old male here for evaluation following chlamydia exposure.  The patient is with a new partner and they have been together for the past 3 months.  She recently tested positive for chlamydia so he came in to be tested.  He is requesting urethral and oral testing.  He denies any pain with urination or discharge from his penis.  No testicle pain or fever.  Past Medical History:  Diagnosis Date   Allergy    Seizures ([place])    Seizures ([place])     Patient Active Problem List   Diagnosis Date Noted   Attention deficit hyperactivity disorder (ADHD), predominantly inattentive type 01/20/2021    Past Surgical History:  Procedure Laterality Date   NO PAST SURGERIES     NO PAST SURGERIES     WISDOM TOOTH EXTRACTION  2015       Home Medications    Prior to Admission medications   Medication Sig Start Date End Date Taking? Authorizing Provider  doxycycline (VIBRAMYCIN) 100 MG capsule Take 1 capsule (100 mg total) by mouth 2 (two) times daily for 7 days. 07/01/22 07/08/22 Yes Margarette Canada, NP  EPINEPHrine (EPIPEN 2-PAK) 0.3 mg/0.3 mL IJ SOAJ injection Inject 0.3 mg into the muscle as needed for anaphylaxis. 01/23/21   Karamalegos, Devonne Doughty, DO  loratadine (CLARITIN) 10 MG tablet Take 10 mg by mouth daily as needed for allergies.    [provider]    Family History History reviewed. No pertinent family history.  Social History Social History   Tobacco Use   Smoking status: Never   Smokeless tobacco: Never  Vaping Use   Vaping Use: Former  Substance Use Topics   Alcohol use: Yes    Alcohol/week: 2.0 standard drinks of alcohol    Types: 2 Standard drinks or equivalent per week   Drug use: No     Allergies   Patient has no known  allergies.   Review of Systems Review of Systems  Constitutional:  Negative for fever.  Genitourinary:  Negative for dysuria, genital sores, penile discharge, penile pain, penile swelling, scrotal swelling, testicular pain and urgency.     Physical Exam Triage Vital Signs ED Triage Vitals  Enc Vitals Group     BP      Pulse      Resp      Temp      Temp src      SpO2      Weight      Height      Head Circumference      Peak Flow      Pain Score      Pain Loc      Pain Edu?      Excl. in [place]?    No data found.  Updated Vital Signs BP 125/81 (BP Location: Left Arm)   Pulse (!) 54   Temp 97.6 F (36.4 C) (Oral)   Resp 16   Ht '5\' 8"'$  (1.727 m)   Wt 195 lb (88.5 kg)   SpO2 96%   BMI 29.65 kg/m   Visual Acuity Right Eye Distance:   Left Eye Distance:   Bilateral Distance:    Right Eye Near:  Left Eye Near:    Bilateral Near:     Physical Exam Vitals and nursing note reviewed.  Constitutional:      Appearance: Normal appearance. He is not ill-appearing.  Skin:    General: Skin is warm and dry.     Capillary Refill: Capillary refill takes less than 2 seconds.  Neurological:     General: No focal deficit present.     Mental Status: He is alert and oriented to person, place, and time.      UC Treatments / Results  Labs (all labs ordered are listed, but only abnormal results are displayed) Labs Reviewed  CYTOLOGY, (ORAL, ANAL, URETHRAL) ANCILLARY ONLY  CYTOLOGY, (ORAL, ANAL, URETHRAL) ANCILLARY ONLY    EKG   Radiology No results found.  Procedures Procedures (including critical care time)  Medications Ordered in UC Medications - No data to display  Initial Impression / Assessment and Plan / UC Course  I have reviewed the triage vital signs and the nursing notes.  Pertinent labs & imaging results that were available during my care of the patient were reviewed by me and considered in my medical decision making (see chart for details).    Patient is a pleasant, nontoxic-appearing 50 old male here requesting testing for chlamydia after learning that his new sexual partner has tested positive.  He is not having any symptoms.  He was originally requesting both genital and oral testing but after discussing that we will empirically treat him he has opted to not undergo the oral testing and just have the genital chlamydia testing.  I will order a male cytology swab and discharge him home on doxycycline 100 mg twice daily for 7 days for treatment of presumptive chlamydia.  I have advised him to abstain from unprotected sex with his partner until after they have both completed antibiotic therapy.   Final Clinical Impressions(s) / UC Diagnoses   Final diagnoses:  Exposure to chlamydia     Discharge Instructions      Take the Doxycycline twice daily with food for 7 days for treatment following your exposure.  Abstain from unprotected intercourse until after you and your partner have completed antibiotic treatment.     ED Prescriptions     Medication Sig Dispense Auth. Provider   doxycycline (VIBRAMYCIN) 100 MG capsule Take 1 capsule (100 mg total) by mouth 2 (two) times daily for 7 days. 14 capsule Margarette Canada, NP      PDMP not reviewed this encounter.   Margarette Canada, NP 07/01/22 1433

## 2022-07-01 NOTE — ED Triage Notes (Signed)
Pt presents to UC for STD testing, states he was exposed to chlamydia. Denies any active sx's, did notice a bump in shaft of penis.

## 2022-07-02 LAB — CYTOLOGY, (ORAL, ANAL, URETHRAL) ANCILLARY ONLY
Chlamydia: POSITIVE — AB
Comment: NEGATIVE
Comment: NEGATIVE
Comment: NORMAL
Neisseria Gonorrhea: NEGATIVE
Trichomonas: NEGATIVE

## 2022-08-04 ENCOUNTER — Other Ambulatory Visit (HOSPITAL_COMMUNITY)
Admission: RE | Admit: 2022-08-04 | Discharge: 2022-08-04 | Disposition: A | Discharge status: Home / Self Care | Payer: 59 | Source: Ambulatory Visit | Attending: Family Medicine | Admitting: Family Medicine

## 2022-08-04 ENCOUNTER — Ambulatory Visit (INDEPENDENT_AMBULATORY_CARE_PROVIDER_SITE_OTHER): Payer: 59 | Admitting: Family Medicine

## 2022-08-04 ENCOUNTER — Encounter: Payer: Self-pay | Admitting: Family Medicine

## 2022-08-04 VITALS — BP 122/60 | HR 82 | Ht 68.0 in | Wt 205.0 lb

## 2022-08-04 DIAGNOSIS — R799 Abnormal finding of blood chemistry, unspecified: Secondary | ICD-10-CM | POA: Insufficient documentation

## 2022-08-04 DIAGNOSIS — R7989 Other specified abnormal findings of blood chemistry: Secondary | ICD-10-CM

## 2022-08-04 DIAGNOSIS — Z113 Encounter for screening for infections with a predominantly sexual mode of transmission: Secondary | ICD-10-CM | POA: Diagnosis not present

## 2022-08-04 DIAGNOSIS — Z114 Encounter for screening for human immunodeficiency virus [HIV]: Secondary | ICD-10-CM | POA: Diagnosis not present

## 2022-08-04 DIAGNOSIS — Z202 Contact with and (suspected) exposure to infections with a predominantly sexual mode of transmission: Secondary | ICD-10-CM | POA: Insufficient documentation

## 2022-08-04 MED ORDER — DOXYCYCLINE HYCLATE 100 MG PO TABS: 100.0000 mg | ORAL_TABLET | Freq: Two times a day (BID) | ORAL | 0 refills | Status: AC

## 2022-08-04 NOTE — Patient Instructions (Addendum)
Thank you for coming to the office today.  Labs today HIV Syphilis RPR  Urine test today for Gonorrheal Chlamydia  -------------------------  Basic Semen Analysis is a sperm test to determine if sperm are present and viable. This can be used as an initial fertility test for men.  - If you would like to check the cost and coverage of the test, you may contact your insurance provider to determine your cost. If you would like to proceed, then notify our office to proceed with the order.  - Information to give to insurance to determine cost: - "Semen Analysis, Basic" - Test Code/#: 782956 - CPT Code: 21308  - To proceed with Semen Analysis testing please follow these instructions: - You will need the following items from our office:    1) Sterile specimen cup / container, within a specimen bag    2) Collection Instructions Form - from [facility]    3) Released order from our office, printed  - The test must be scheduled at least 1 week in advance. They can schedule you weeks or months in advance. - First, read the "Collection Instructions" closely - ask any questions you have when you call to schedule - You need to schedule your specimen drop off appointment with [facility]: Call [phone] (Semen Analysis Scheduling)     (Only 1 [facility] office locally [place], [place]) offers this test. If you are outside area, [facility] can locate a different office for you) - Pick your drop off date and time. They offer AM and Afternoon times. The specimen will need to be collected and then dropped off within 1 hour of collection following their specific instructions. - You should be abstinent / without ejaculation by any means at least 2 to 7 days prior to the specimen collection - On the day of your collection, before you collect your sample, complete the "bottom half of the Information Form" and bring this with your specimen for drop off  Additionally, if further questions regarding interpretation of  semen analysis remain, we may consult Urology Specialist. Or if additional fertility testing is required, we will often assist in referring to a Fertility Clinic.   Please schedule a Follow-up Appointment to: Return if symptoms worsen or fail to improve.  If you have any other questions or concerns, please feel free to call the office or send a message through MyChart. You may also schedule an earlier appointment if necessary.  Additionally, you may be receiving a survey about your experience at our office within a few days to 1 week by e-mail or mail. We value your feedback.  [name], DO [facility] [place]

## 2022-08-04 NOTE — Progress Notes (Signed)
Subjective:    Patient ID: Erik Bailey, male    DOB: 21-Sep-1995, 27 y.o.   MRN: [id number]  Erik Bailey is a 27 y.o. male presenting on 08/04/2022 for Exposure to STD (Pt. States that he tested positive for chlamydia.Pt. also wants to talk to doctor about fertility testing.)  He is moving to [place] in 1-2 days  HPI  History of Chlamydia Exposure to STD History reviewed today. Contracted Chlamydia December 2023. Found out in January 2024. Tested positive Chlamydia 07/01/22 and treated with Doxycycyline x 7 days, he lost 1 pill and took 6.5 days. No further concerns but now requesting repeat test of cure. No other STD screening done recently.  Also asking about future Fertility testing Other fam members are infertile  Abnormal Lab results Labs done by life insurance screen showed elevated BUN and Creatinine. Mostly carnivore diet high protein. He reports results with BUN 33 and Cr 1.5 Otherwise liver enzymes and cholesterol within range.      08/04/2022    4:10 PM 01/20/2021    3:13 PM  Depression screen PHQ 2/9  Decreased Interest 0 2  Down, Depressed, Hopeless 0 0  PHQ - 2 Score 0 2  Altered sleeping 0 0  Tired, decreased energy 0 0  Change in appetite 1 2  Feeling bad or failure about yourself  0 0  Trouble concentrating 0 1  Moving slowly or fidgety/restless 0 0  Suicidal thoughts 0 0  PHQ-9 Score 1 5  Difficult doing work/chores Not difficult at all Not difficult at all    Social History   Tobacco Use   Smoking status: Never   Smokeless tobacco: Never  Vaping Use   Vaping Use: Former  Substance Use Topics   Alcohol use: Yes    Alcohol/week: 2.0 standard drinks of alcohol    Types: 2 Standard drinks or equivalent per week   Drug use: No    Review of Systems Per HPI unless specifically indicated above     Objective:    BP 122/60 (BP Location: Left Arm, Patient Position: Sitting)   Pulse 82   Ht  (1.727 m)   Wt 205 lb (93 kg)   SpO2 97%   BMI  31.17 kg/m   Wt Readings from Last 3 Encounters:  08/04/22 205 lb (93 kg)  07/01/22 195 lb (88.5 kg)  06/16/21 215 lb 9.6 oz (97.8 kg)    Physical Exam Vitals and nursing note reviewed.  Constitutional:      General: He is not in acute distress.    Appearance: Normal appearance. He is well-developed. He is not diaphoretic.     Comments: Well-appearing, comfortable, cooperative  HENT:     Head: Normocephalic and atraumatic.  Eyes:     General:        Right eye: No discharge.        Left eye: No discharge.     Conjunctiva/sclera: Conjunctivae normal.  Cardiovascular:     Rate and Rhythm: Normal rate.  Pulmonary:     Effort: Pulmonary effort is normal.  Skin:    General: Skin is warm and dry.     Findings: No erythema or rash.  Neurological:     Mental Status: He is alert and oriented to person, place, and time.  Psychiatric:        Mood and Affect: Mood normal.        Behavior: Behavior normal.        Thought Content: Thought  content normal.     Comments: Well groomed, good eye contact, normal speech and thoughts    Results for orders placed or performed during the hospital encounter of 07/01/22  Cytology (oral, anal, urethral) ancillary only  Result Value Ref Range   Neisseria Gonorrhea Negative    Chlamydia Positive (A)    Trichomonas Negative    Comment Normal Reference Ranger Chlamydia - Negative    Comment      Normal Reference Range Neisseria Gonorrhea - Negative   Comment Normal Reference Range Trichomonas - Negative       Assessment & Plan:   Problem List Items Addressed This Visit   None Visit Diagnoses     Exposure to chlamydia    -  Primary   Relevant Medications   doxycycline (VIBRA-TABS) 100 MG tablet   Other Relevant Orders   Urine cytology ancillary only   Screen for STD (sexually transmitted disease)       Relevant Orders   HIV Antibody (routine testing w rflx)   RPR   Urine cytology ancillary only   Screening for HIV (human  immunodeficiency virus)       Relevant Orders   HIV Antibody (routine testing w rflx)   Urine cytology ancillary only   Elevated BUN       Relevant Orders   Urine cytology ancillary only   Elevated serum creatinine       Relevant Orders   Urine cytology ancillary only       History of Chlamydia infection 06/2022 tested, treated Doxycycline, missed 1 pill out of 7 day course Asymptomatic currently Repeat test today urine gc chlamydia testing send out to [facility], pending result. Printed empiric Doxycycline TWICE A DAY dosing x 10 day course as back up plan only if positive or future exposure. He is moving out of state 1[place]2 days, advised that easiest to give him printed rx, instead of order to out of state pharmacy if needed. He will check results on MyChart.  Add on HIV and RPR Syphilis testing to the STD screen today.  Discussed elevated BUN and Creatinine Likely due to carnivore high protein intake diet No other concerns on labs. Reassurance provided. Discussed follow up items for future lab draw.  Fertility Testing We discussed this topic today but unable to provide orders for him due to him moving out of state in 1-2 days. Advised protocol for Basic Semen Analysis, he can check with [facility] when he moves or other lab site, and they can arrange orders or establish w/ new PCP  Orders Placed This Encounter  Procedures   HIV Antibody (routine testing w rflx)   RPR     Meds ordered this encounter  Medications   doxycycline (VIBRA-TABS) 100 MG tablet    Sig: Take 1 tablet (100 mg total) by mouth 2 (two) times daily. For 10 days. Take with full glass of water, stay upright 30 min after taking.    Dispense:  20 tablet    Refill:  0      Follow up plan: Return if symptoms worsen or fail to improve.  [name], DO [facility] 08/04/2022, 4:20 PM

## 2022-08-05 DIAGNOSIS — Z202 Contact with and (suspected) exposure to infections with a predominantly sexual mode of transmission: Secondary | ICD-10-CM | POA: Diagnosis present

## 2022-08-05 DIAGNOSIS — Z113 Encounter for screening for infections with a predominantly sexual mode of transmission: Secondary | ICD-10-CM | POA: Diagnosis present

## 2022-08-05 DIAGNOSIS — R7989 Other specified abnormal findings of blood chemistry: Secondary | ICD-10-CM | POA: Diagnosis present

## 2022-08-05 DIAGNOSIS — Z114 Encounter for screening for human immunodeficiency virus [HIV]: Secondary | ICD-10-CM | POA: Diagnosis present

## 2022-08-05 DIAGNOSIS — R799 Abnormal finding of blood chemistry, unspecified: Secondary | ICD-10-CM | POA: Diagnosis present

## 2022-08-05 LAB — HIV ANTIBODY (ROUTINE TESTING W REFLEX): HIV 1&2 Ab, 4th Generation: NONREACTIVE

## 2022-08-05 LAB — RPR: RPR Ser Ql: NONREACTIVE

## 2022-08-06 LAB — URINE CYTOLOGY ANCILLARY ONLY
Bacterial Vaginitis-Urine: NEGATIVE
Candida Urine: NEGATIVE
Chlamydia: NEGATIVE
Comment: NEGATIVE
Comment: NEGATIVE
Comment: NORMAL
Neisseria Gonorrhea: NEGATIVE
Trichomonas: NEGATIVE

## 2022-10-04 IMAGING — DX DG ANKLE COMPLETE 3+V*L*
3 series · 3 of 3 positions shown · non-contrast
Comparison: Left ankle radiographs 07/31/2011

CLINICAL DATA: Ankle inversion/brain.

EXAM:
LEFT ANKLE COMPLETE - 3+ VIEW

[ankle ap]
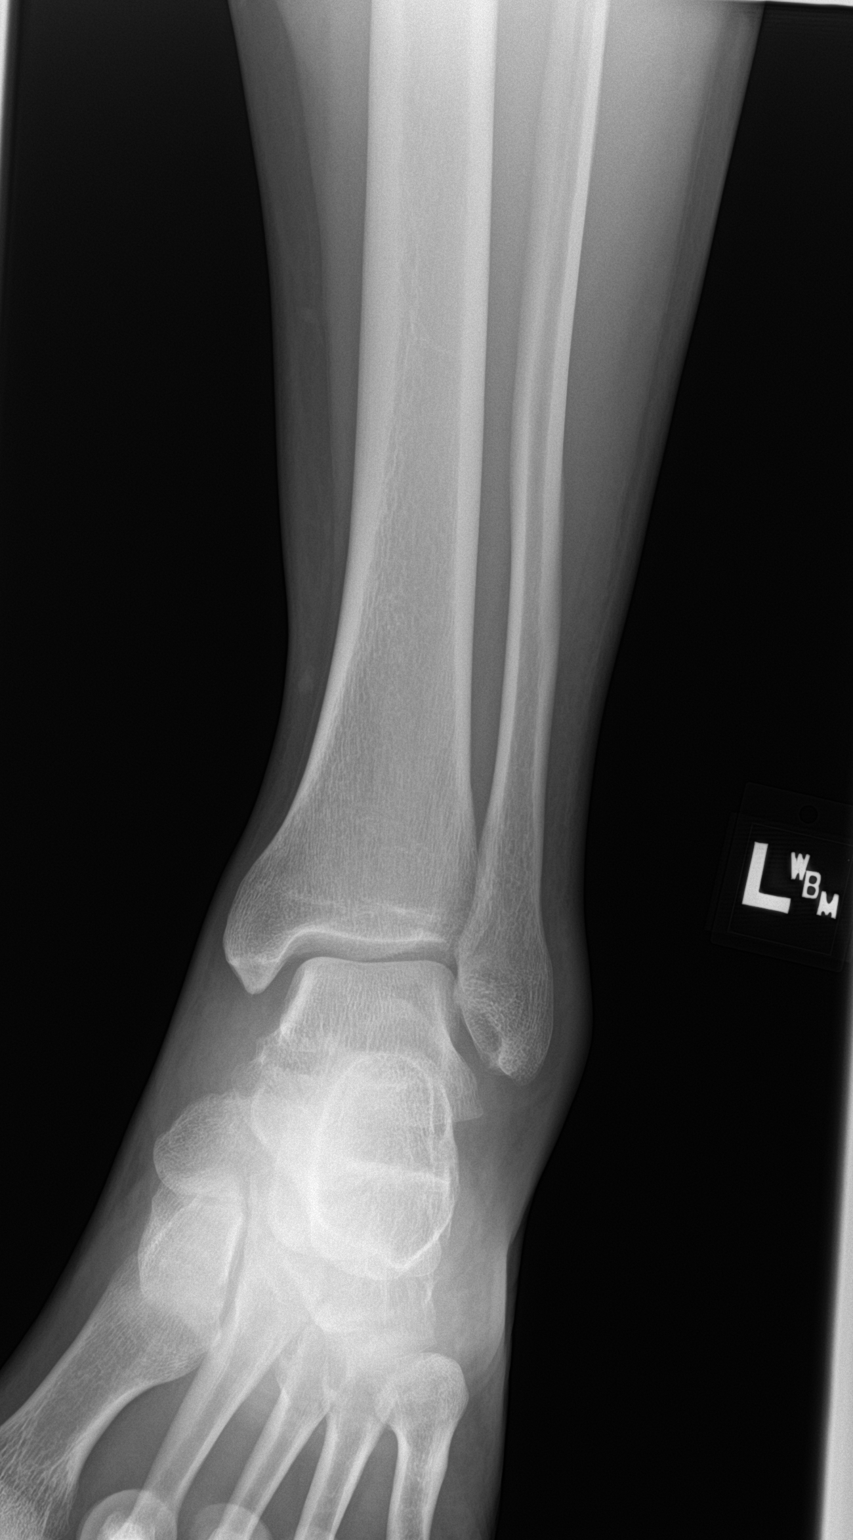

[ankle obl]
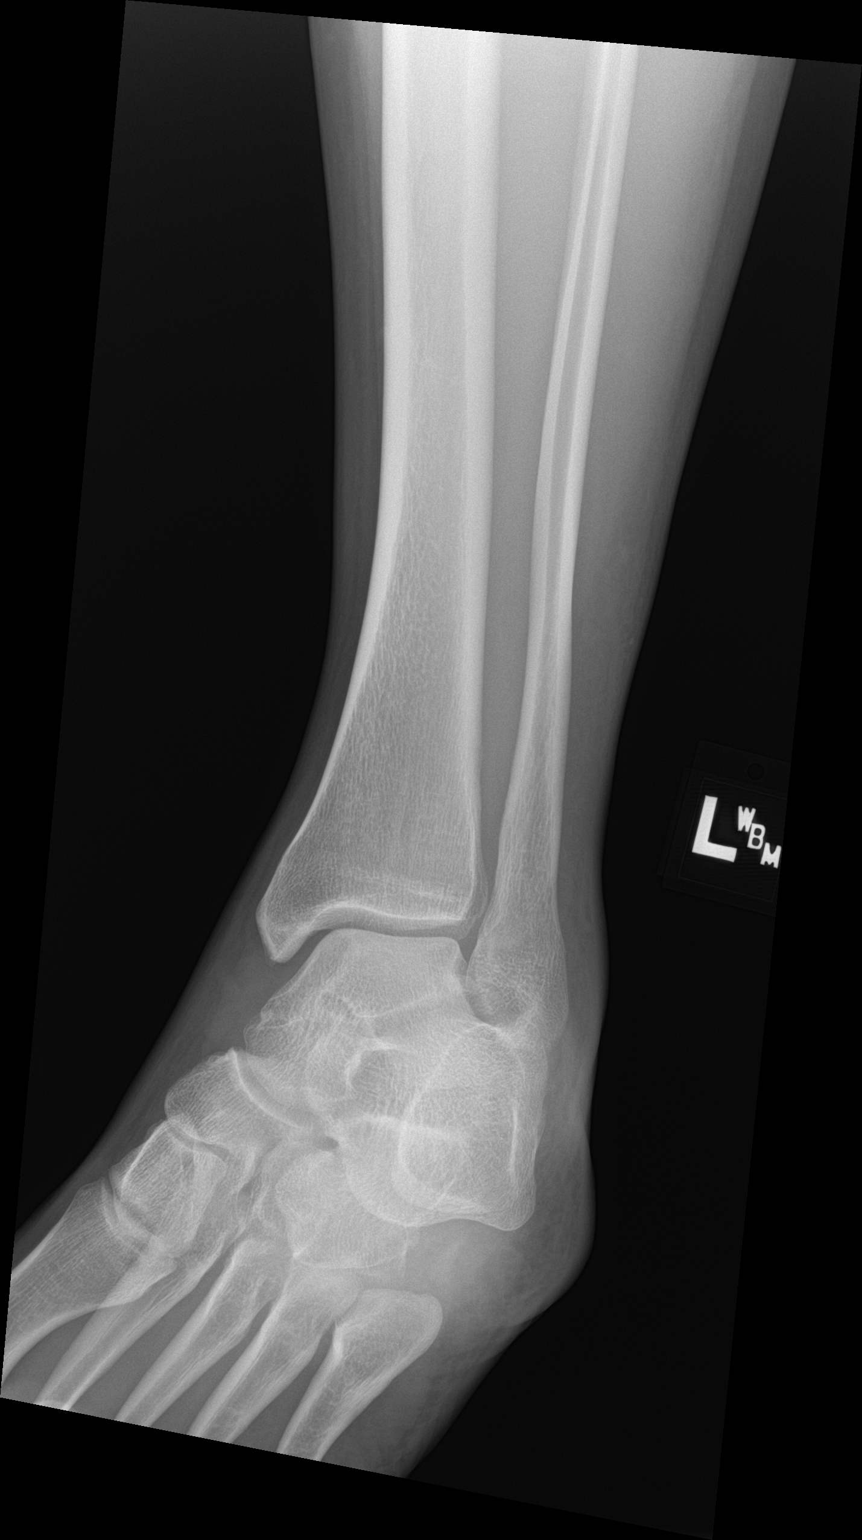

[ankle lat]
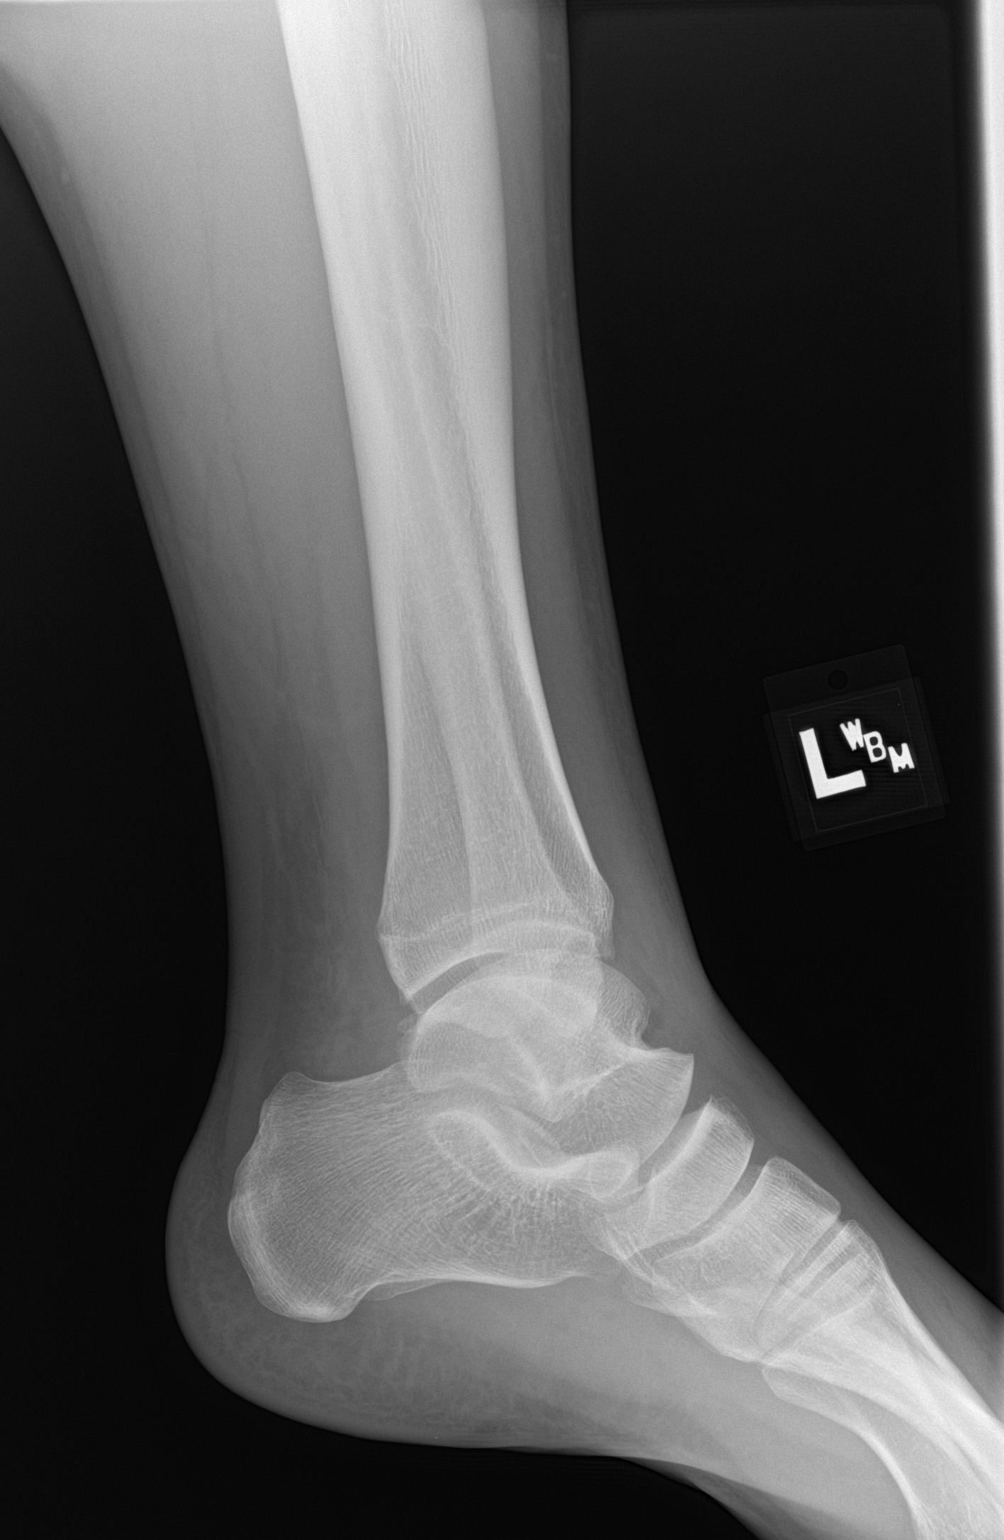

[3 of 3 positions shown; findings below may reference images not displayed]

FINDINGS: There is no evidence of fracture, dislocation, or joint effusion.
There is no evidence of arthropathy or other focal bone abnormality.
Regional soft tissue swelling.
IMPRESSION: No acute osseous abnormality in the left ankle.
